# Patient Record
Sex: Female | Born: 1965 | Race: White | Hispanic: No | Marital: Married
Health system: Southern US, Community
[De-identification: ages and names within clinical notes are randomized; demographics above are authoritative.]

---

## 2017-12-04 ENCOUNTER — Other Ambulatory Visit: Payer: Self-pay | Admitting: *Deleted

## 2017-12-04 DIAGNOSIS — N6310 Unspecified lump in the right breast, unspecified quadrant: Secondary | ICD-10-CM

## 2017-12-24 ENCOUNTER — Ambulatory Visit
Admission: RE | Admit: 2017-12-24 | Discharge: 2017-12-24 | Disposition: A | Discharge status: Home / Self Care | Payer: 59 | Source: Ambulatory Visit | Attending: *Deleted | Admitting: *Deleted

## 2017-12-24 DIAGNOSIS — N6310 Unspecified lump in the right breast, unspecified quadrant: Secondary | ICD-10-CM

## 2020-10-24 ENCOUNTER — Encounter (HOSPITAL_COMMUNITY): Payer: Self-pay | Admitting: Emergency Medicine

## 2020-10-24 ENCOUNTER — Emergency Department (HOSPITAL_COMMUNITY)
Admission: EM | Admit: 2020-10-24 | Discharge: 2020-10-25 | Disposition: A | Discharge status: Home / Self Care | Payer: 59 | Attending: Emergency Medicine | Admitting: Emergency Medicine

## 2020-10-24 ENCOUNTER — Other Ambulatory Visit: Payer: Self-pay

## 2020-10-24 ENCOUNTER — Emergency Department (HOSPITAL_COMMUNITY): Payer: 59

## 2020-10-24 DIAGNOSIS — Y92830 Public park as the place of occurrence of the external cause: Secondary | ICD-10-CM | POA: Insufficient documentation

## 2020-10-24 DIAGNOSIS — S0083XA Contusion of other part of head, initial encounter: Secondary | ICD-10-CM | POA: Insufficient documentation

## 2020-10-24 DIAGNOSIS — Y999 Unspecified external cause status: Secondary | ICD-10-CM | POA: Diagnosis not present

## 2020-10-24 DIAGNOSIS — R4182 Altered mental status, unspecified: Secondary | ICD-10-CM | POA: Diagnosis not present

## 2020-10-24 DIAGNOSIS — W19XXXA Unspecified fall, initial encounter: Secondary | ICD-10-CM

## 2020-10-24 DIAGNOSIS — W1839XA Other fall on same level, initial encounter: Secondary | ICD-10-CM | POA: Diagnosis not present

## 2020-10-24 DIAGNOSIS — Y9389 Activity, other specified: Secondary | ICD-10-CM | POA: Diagnosis not present

## 2020-10-24 DIAGNOSIS — S0990XA Unspecified injury of head, initial encounter: Secondary | ICD-10-CM | POA: Diagnosis present

## 2020-10-24 LAB — CBC
HCT: 41.5 % (ref 36.0–46.0)
Hemoglobin: 14.4 g/dL (ref 12.0–15.0)
MCH: 29.6 pg (ref 26.0–34.0)
MCHC: 34.7 g/dL (ref 30.0–36.0)
MCV: 85.4 fL (ref 80.0–100.0)
Platelets: 250 10*3/uL (ref 150–400)
RBC: 4.86 MIL/uL (ref 3.87–5.11)
RDW: 12.8 % (ref 11.5–15.5)
WBC: 5.8 10*3/uL (ref 4.0–10.5)
nRBC: 0 % (ref 0.0–0.2)

## 2020-10-24 LAB — URINALYSIS, ROUTINE W REFLEX MICROSCOPIC
Bilirubin Urine: NEGATIVE
Glucose, UA: NEGATIVE mg/dL
Hgb urine dipstick: NEGATIVE
Ketones, ur: NEGATIVE mg/dL
Leukocytes,Ua: NEGATIVE
Nitrite: NEGATIVE
Protein, ur: NEGATIVE mg/dL
Specific Gravity, Urine: 1.02 (ref 1.005–1.030)
pH: 6.5 (ref 5.0–8.0)

## 2020-10-24 LAB — BASIC METABOLIC PANEL
Anion gap: 8 (ref 5–15)
BUN: 17 mg/dL (ref 6–20)
CO2: 28 mmol/L (ref 22–32)
Calcium: 9.1 mg/dL (ref 8.9–10.3)
Chloride: 103 mmol/L (ref 98–111)
Creatinine, Ser: 0.78 mg/dL (ref 0.44–1.00)
GFR, Estimated: >60 mL/min (ref >60)
Glucose, Bld: 163 mg/dL — ABNORMAL HIGH (ref 70–99)
Potassium: 3.8 mmol/L (ref 3.5–5.1)
Sodium: 139 mmol/L (ref 135–145)

## 2020-10-24 NOTE — ED Triage Notes (Signed)
Patient states she was walking in the park today and she fell and hit the right side of her face and bilateral knees. Patient unsure if she lost consciousness but believes she remembers the entire incident.

## 2020-10-24 NOTE — ED Provider Notes (Signed)
[facility]PT [facility] Emergency Department Provider Note MRN:  [id number]  Arrival date & time: 10/25/20     Chief Complaint   Fall   History of Present Illness   Sonya Todd is a 55 y.o. year-old female with no pertinent past medical presenting to the ED with chief complaint of fall.  Patient thinks that she lost her balance and fell at the park.  This occurred 5 or 6 hours ago.  Unsure if she lost consciousness, thinks she might have for a few moments.  Denies any neck pain or back pain, no chest pain or shortness of breath, no abdominal pain.  Mild soreness to the knees but able to walk.  Endorsing mostly pain to the right side of the face and head, moderate severity, worse with palpation.  Denies any nausea or vomiting since the fall but does endorse intermittent crying.  Review of Systems  A complete 10 system review of systems was obtained and all systems are negative except as noted in the HPI and PMH.   Patient's Health History   History reviewed. No pertinent past medical history.  History reviewed. No pertinent surgical history.  Family History  Problem Relation Age of Onset  . Breast cancer Neg Hx     Social History   Socioeconomic History  . Marital status: Married    Spouse name: Not on file  . Number of children: Not on file  . Years of education: Not on file  . Highest education level: Not on file  Occupational History  . Not on file  Tobacco Use  . Smoking status: Not on file  . Smokeless tobacco: Not on file  Substance and Sexual Activity  . Alcohol use: Not on file  . Drug use: Not on file  . Sexual activity: Not on file  Other Topics Concern  . Not on file  Social History Narrative  . Not on file   Social Determinants of Health   Financial Resource Strain: Not on file  Food Insecurity: Not on file  Transportation Needs: Not on file  Physical Activity: Not on file  Stress: Not on file  Social Connections: Not on file  Intimate  Partner Violence: Not on file     Physical Exam   Vitals:   10/24/20 2142 10/25/20 0039  BP: 115/70 119/63  Pulse: 80 68  Resp: 18 18  Temp: 97.6 F (36.4 C)   SpO2: 99% 99%    CONSTITUTIONAL: Well-appearing, NAD NEURO:  Alert and oriented x 3, no focal deficits EYES:  eyes equal and reactive ENT/NECK:  no LAD, no JVD CARDIO: Regular rate, well-perfused, normal S1 and S2 PULM:  CTAB no wheezing or rhonchi GI/GU:  normal bowel sounds, non-distended, non-tender MSK/SPINE:  No gross deformities, no edema SKIN: Abrasions to right cheek PSYCH:  Appropriate speech and behavior  *Additional and/or pertinent findings included in MDM below  Diagnostic and Interventional Summary    EKG Interpretation  Date/Time:  Wednesday October 24 2020 19:13:08 EST Ventricular Rate:  93 PR Interval:  184 QRS Duration: 88 QT Interval:  336 QTC Calculation: 417 R Axis:   52 Text Interpretation: Normal sinus rhythm Nonspecific T wave abnormality Abnormal ECG No old tracing to compare Confirmed by Dione Booze ([id number]) on 10/24/2020 11:09:08 PM      Labs Reviewed  BASIC METABOLIC PANEL - Abnormal; Notable for the following components:      Result Value   Glucose, Bld 163 (*)    All other components  within normal limits  CBC  URINALYSIS, ROUTINE W REFLEX MICROSCOPIC  CBG MONITORING, ED    CT HEAD WO CONTRAST  Final Result    CT Maxillofacial Wo Contrast  Final Result      Medications - No data to display   Procedures  /  Critical Care Procedures  ED Course and Medical Decision Making  I have reviewed the triage vital signs, the nursing notes, and pertinent available records from the EMR.  Listed above are laboratory and imaging tests that I personally ordered, reviewed, and interpreted and then considered in my medical decision making (see below for details).  Suspect mechanical fall with facial trauma.  CT to exclude intracranial bleeding or zygomatic process fracture.  Labs and  EKG done in triage are reassuring.  Normal vital signs, well-appearing, normal neuro exam, anticipating discharge if remainder of work-up is reassuring     CT is normal, appropriate for discharge.  Elmer Sow. Pilar Plate, MD [facility] Emergency [facility] [email or web address]  Final Clinical Impressions(s) / ED Diagnoses     ICD-10-CM   1. Fall, initial encounter  W19.XXXA   2. Contusion of face, initial encounter  S00.83XA     ED Discharge Orders    None       Discharge Instructions Discussed with and Provided to Patient:     Discharge Instructions     You were evaluated in the Emergency Department and after careful evaluation, we did not find any emergent condition requiring admission or further testing in the hospital.  Your exam/testing today was overall reassuring.  CT did not show any injuries to your brain, no broken bones.  Please return to the Emergency Department if you experience any worsening of your condition.  Thank you for allowing [place] to be a part of your care.        Sabas Sous, MD 10/25/20 0040

## 2020-10-25 NOTE — Discharge Instructions (Addendum)
You were evaluated in the Emergency Department and after careful evaluation, we did not find any emergent condition requiring admission or further testing in the hospital.  Your exam/testing today was overall reassuring.  CT did not show any injuries to your brain, no broken bones.  Please return to the Emergency Department if you experience any worsening of your condition.  Thank you for allowing [place] to be a part of your care.

## 2022-09-18 IMAGING — CT CT MAXILLOFACIAL W/O CM
3 of 8 series · 14 of 47 positions shown, 17 images · non-contrast
Comparison: None.

CLINICAL DATA: Fall

EXAM:
CT MAXILLOFACIAL WITHOUT CONTRAST
TECHNIQUE: Multidetector CT imaging of the maxillofacial structures was
performed. Multiplanar CT image reconstructions were also generated.

[Series 4: st thins · axial · 0.31mm/px · z∈[-241,-102]mm · 9 of 248 slices shown, 12 images]
[im 25/248  brain]
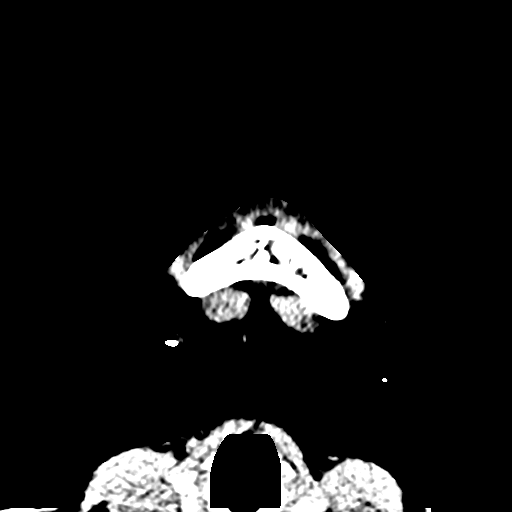
[im 25/248  bone]
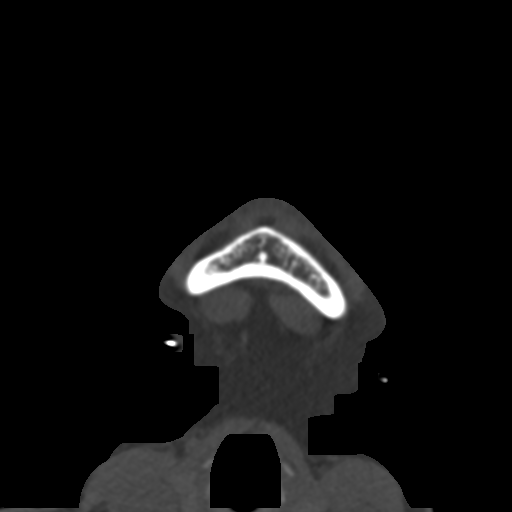
[im 50/248  bone]
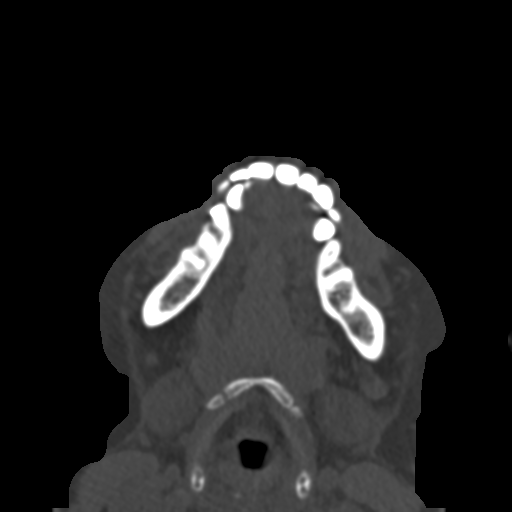
[im 75/248  bone]
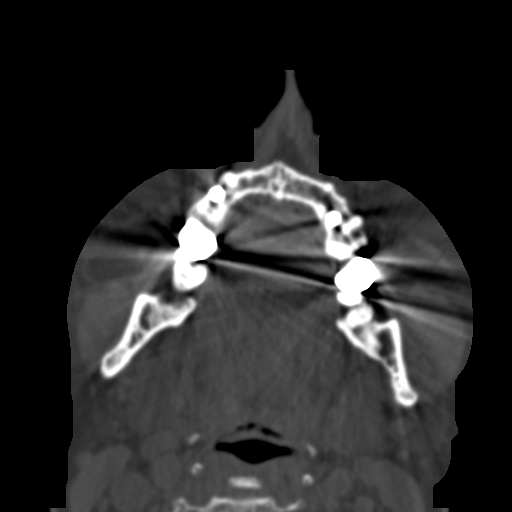
[im 99/248  bone]
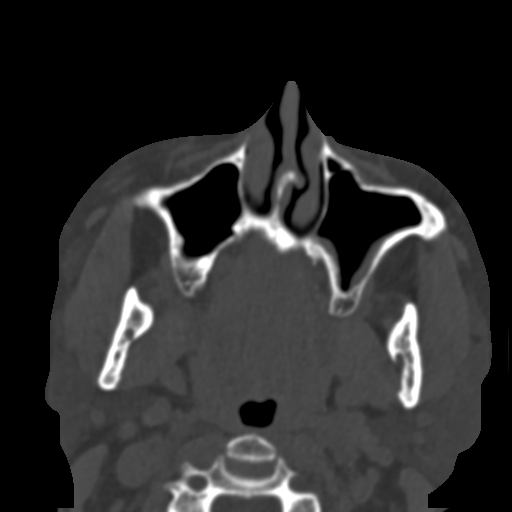
[im 124/248  brain]
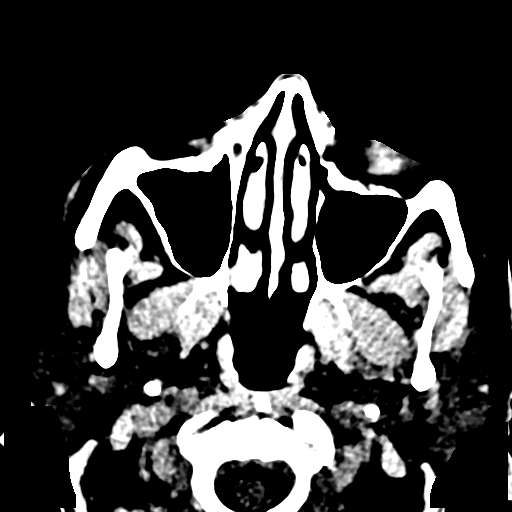
[im 124/248  bone]
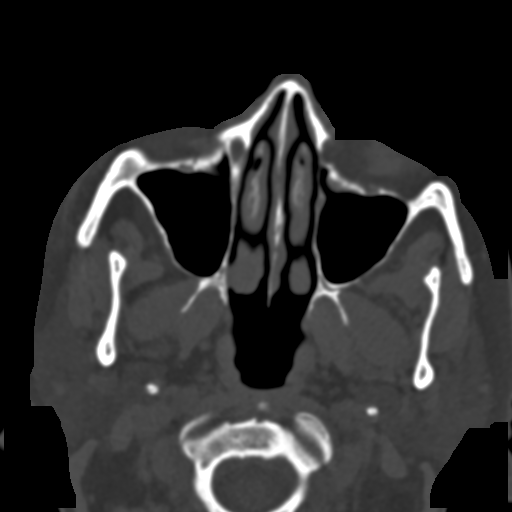
[im 149/248  bone]
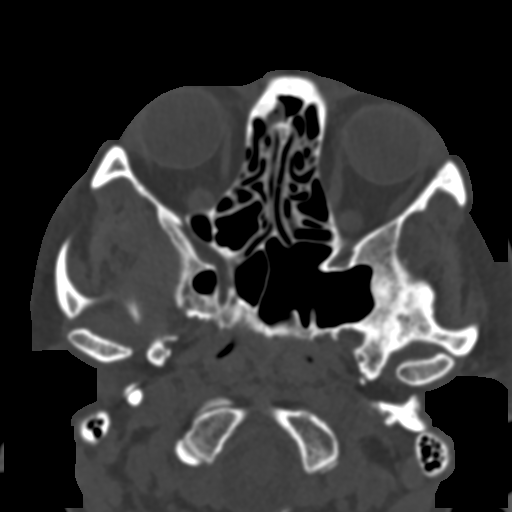
[im 173/248  bone]
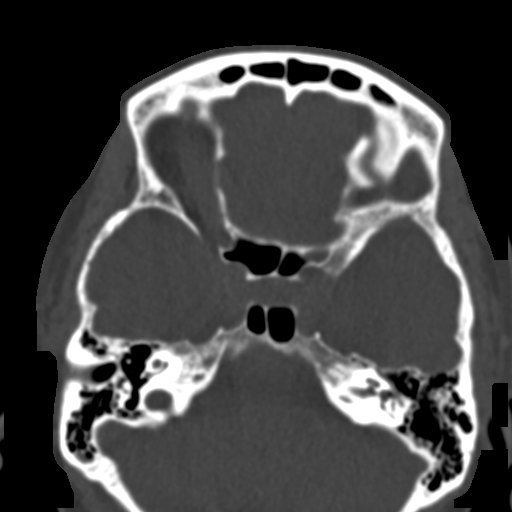
[im 198/248  bone]
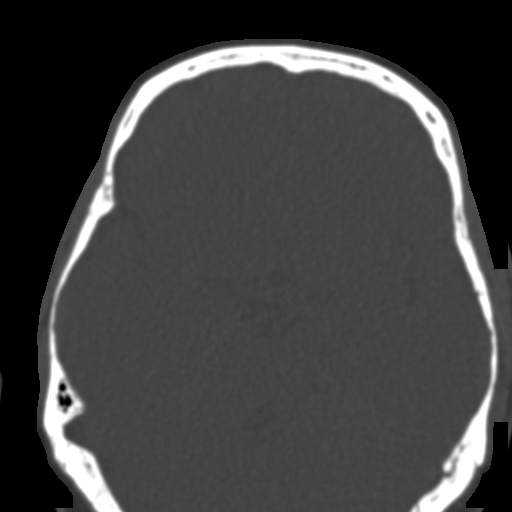
[im 223/248  brain]
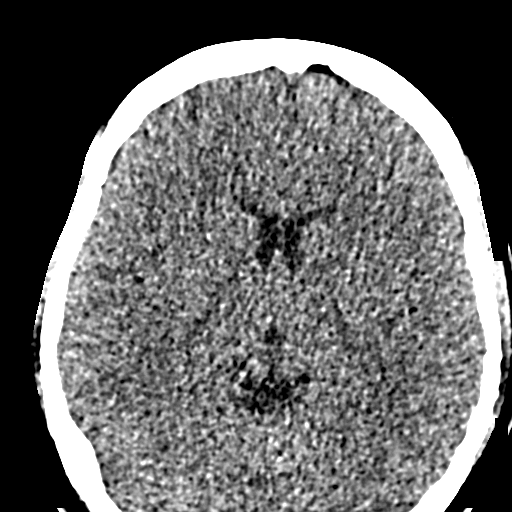
[im 223/248  bone]
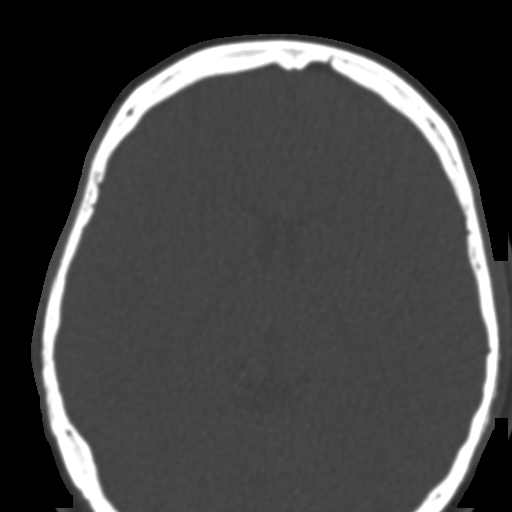

[Series 7: st cor · coronal · 0.32mm/px · 3 of 77 slices shown]
[im 20/77  bone]
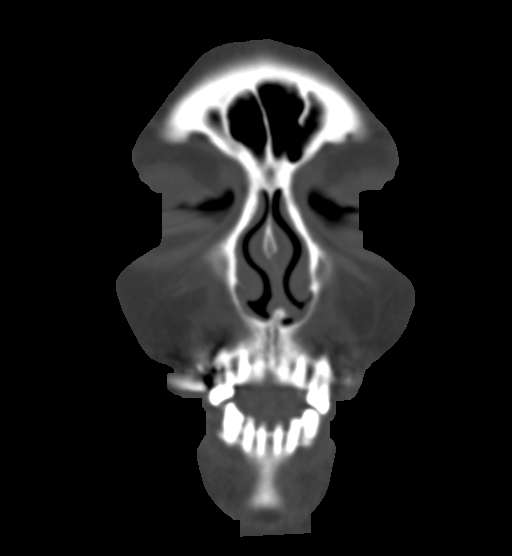
[im 39/77  bone]
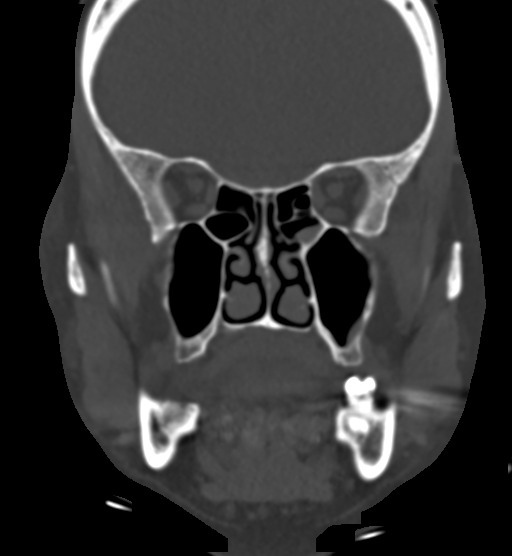
[im 58/77  bone]
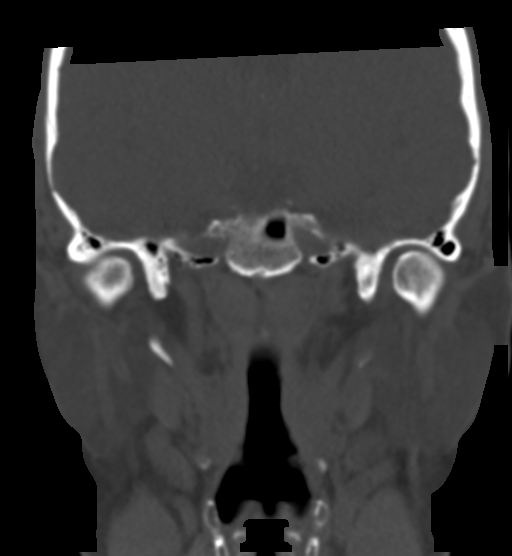

[Series 10: bone sag · sagittal · 0.30mm/px · 2 of 82 slices shown]
[im 28/82  bone]
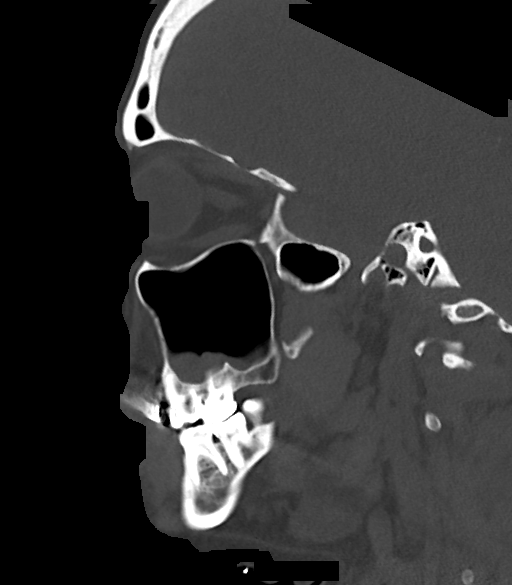
[im 55/82  bone]
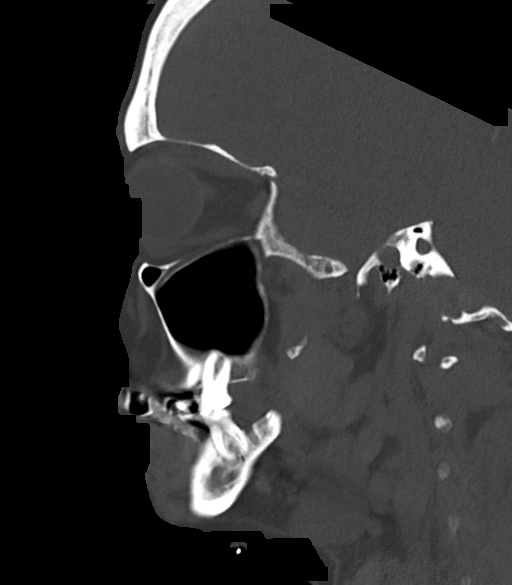

[14 of 47 positions shown; findings below may reference images not displayed]

FINDINGS: Osseous: No fracture or mandibular dislocation. No destructive
process.

Orbits: Negative. No traumatic or inflammatory finding.

Sinuses: Mucosal thickening in the paranasal sinuses. No air-fluid
levels.

Soft tissues: Negative

Limited intracranial: See head CT report.
IMPRESSION: No evidence of facial or orbital fracture.
